# Patient Record
Sex: Male | Born: 1995 | Race: Black or African American | Hispanic: No | Marital: Single | Smoking: Never smoker
Health system: Southern US, Community
[De-identification: ages and names within clinical notes are randomized; demographics above are authoritative.]

## PROBLEM LIST (undated history)

## (undated) HISTORY — PX: ABDOMINAL SURGERY: SHX537

---

## 2016-02-21 ENCOUNTER — Emergency Department (INDEPENDENT_AMBULATORY_CARE_PROVIDER_SITE_OTHER)
Admission: EM | Admit: 2016-02-21 | Discharge: 2016-02-21 | Disposition: A | Discharge status: Home / Self Care | Payer: Medicaid Other | Source: Home / Self Care | Attending: Family Medicine | Admitting: Family Medicine

## 2016-02-21 ENCOUNTER — Emergency Department (INDEPENDENT_AMBULATORY_CARE_PROVIDER_SITE_OTHER): Payer: Medicaid Other

## 2016-02-21 ENCOUNTER — Encounter (HOSPITAL_COMMUNITY): Payer: Self-pay | Admitting: *Deleted

## 2016-02-21 DIAGNOSIS — S93401A Sprain of unspecified ligament of right ankle, initial encounter: Secondary | ICD-10-CM | POA: Diagnosis not present

## 2016-02-21 MED ORDER — IBUPROFEN 800 MG PO TABS: 800.0000 mg | ORAL_TABLET | Freq: Three times a day (TID) | ORAL | Status: AC

## 2016-02-21 NOTE — Discharge Instructions (Signed)
Wear ankle support as needed for comfort, activity as tolerated. advil or pain medicine and ice as needed, return or see orthopedist if further problems.

## 2016-02-21 NOTE — ED Provider Notes (Signed)
CSN: [id number]     Arrival date & time 02/21/16  1727 History   First MD Initiated Contact with Patient 02/21/16 1806     Chief Complaint  Patient presents with  . Ankle Pain   (Consider location/radiation/quality/duration/timing/severity/associated sxs/prior Treatment) Patient is a 20 y.o. male presenting with ankle pain. The history is provided by the patient.  Ankle Pain Location:  Ankle Time since incident:  1 day Injury: yes   Mechanism of injury: fall   Mechanism of injury comment:  Running track and stepped in hole twisting right ankle Ankle location:  R ankle   History reviewed. No pertinent past medical history. History reviewed. No pertinent past surgical history. No family history on file. Social History  Substance Use Topics  . Smoking status: Never Smoker   . Smokeless tobacco: None  . Alcohol Use: No    Review of Systems  Constitutional: Negative.   Musculoskeletal: Positive for joint swelling and gait problem.  Skin: Negative.   All other systems reviewed and are negative.   Allergies  Review of patient's allergies indicates no known allergies.  Home Medications   Prior to Admission medications   Not on File   Meds Ordered and Administered this Visit  Medications - No data to display  BP 112/69 mmHg  Pulse 79  Temp(Src) 98.8 F (37.1 C) (Oral)  Resp 12  SpO2 100% No data found.   Physical Exam  Constitutional: He is oriented to person, place, and time. He appears well-developed and well-nourished. No distress.  Musculoskeletal: He exhibits tenderness.       Right ankle: He exhibits decreased range of motion and swelling. He exhibits no deformity and normal pulse. Tenderness. Lateral malleolus tenderness found. No medial malleolus, no head of 5th metatarsal and no proximal fibula tenderness found. Achilles tendon normal. Achilles tendon exhibits normal Thompson's test results.       Feet:  Neurological: He is alert and oriented to person,  place, and time.  Skin: Skin is warm and dry.  Nursing note and vitals reviewed.   ED Course  Procedures (including critical care time)  Labs Review Labs Reviewed - No data to display  Imaging Review No results found.   Visual Acuity Review  Right Eye Distance:   Left Eye Distance:   Bilateral Distance:    Right Eye Near:   Left Eye Near:    Bilateral Near:         MDM  No diagnosis found.  Meds ordered this encounter  Medications  . ibuprofen (ADVIL,MOTRIN) 800 MG tablet    Sig: Take 1 tablet (800 mg total) by mouth 3 (three) times daily.    Dispense:  30 tablet    Refill:  0      Linna Hoff, MD 02/21/16 [phone]

## 2016-02-21 NOTE — ED Notes (Signed)
Pt  Reports  He stepped  In a  [employer]  And  Injured  His  r  Ankle    -  He  Has  Pain  In  The  Affected  Ankle  With  Pain     On  Weight   Bearing

## 2016-08-18 ENCOUNTER — Encounter (HOSPITAL_COMMUNITY): Payer: Self-pay | Admitting: Emergency Medicine

## 2016-08-18 DIAGNOSIS — Z5321 Procedure and treatment not carried out due to patient leaving prior to being seen by health care provider: Secondary | ICD-10-CM | POA: Insufficient documentation

## 2016-08-18 DIAGNOSIS — R109 Unspecified abdominal pain: Secondary | ICD-10-CM | POA: Insufficient documentation

## 2016-08-18 LAB — COMPREHENSIVE METABOLIC PANEL
ALT: 59 U/L (ref 17–63)
ANION GAP: 5 (ref 5–15)
AST: 104 U/L — ABNORMAL HIGH (ref 15–41)
Albumin: 4 g/dL (ref 3.5–5.0)
Alkaline Phosphatase: 39 U/L (ref 38–126)
BILIRUBIN TOTAL: 1.1 mg/dL (ref 0.3–1.2)
BUN: 10 mg/dL (ref 6–20)
CHLORIDE: 104 mmol/L (ref 101–111)
CO2: 29 mmol/L (ref 22–32)
Calcium: 9.4 mg/dL (ref 8.9–10.3)
Creatinine, Ser: 1.05 mg/dL (ref 0.61–1.24)
Glucose, Bld: 103 mg/dL — ABNORMAL HIGH (ref 65–99)
POTASSIUM: 4 mmol/L (ref 3.5–5.1)
Sodium: 138 mmol/L (ref 135–145)
TOTAL PROTEIN: 6.7 g/dL (ref 6.5–8.1)

## 2016-08-18 LAB — CBC WITH DIFFERENTIAL/PLATELET
Basophils Absolute: 0 10*3/uL (ref 0.0–0.1)
Basophils Relative: 0 %
EOS ABS: 0.1 10*3/uL (ref 0.0–0.7)
Eosinophils Relative: 1 %
HCT: 39.8 % (ref 39.0–52.0)
Hemoglobin: 12.9 g/dL — ABNORMAL LOW (ref 13.0–17.0)
LYMPHS ABS: 1.9 10*3/uL (ref 0.7–4.0)
LYMPHS PCT: 37 %
MCH: 29.8 pg (ref 26.0–34.0)
MCHC: 32.4 g/dL (ref 30.0–36.0)
MCV: 91.9 fL (ref 78.0–100.0)
MONO ABS: 0.3 10*3/uL (ref 0.1–1.0)
MONOS PCT: 7 %
Neutro Abs: 2.8 10*3/uL (ref 1.7–7.7)
Neutrophils Relative %: 55 %
PLATELETS: 213 10*3/uL (ref 150–400)
RBC: 4.33 MIL/uL (ref 4.22–5.81)
RDW: 12.4 % (ref 11.5–15.5)
WBC: 5.1 10*3/uL (ref 4.0–10.5)

## 2016-08-18 LAB — URINALYSIS, ROUTINE W REFLEX MICROSCOPIC
Bilirubin Urine: NEGATIVE
Glucose, UA: NEGATIVE mg/dL
Hgb urine dipstick: NEGATIVE
Ketones, ur: NEGATIVE mg/dL
Leukocytes, UA: NEGATIVE
NITRITE: NEGATIVE
PROTEIN: NEGATIVE mg/dL
SPECIFIC GRAVITY, URINE: 1.008 (ref 1.005–1.030)
pH: 7 (ref 5.0–8.0)
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
## 2016-08-18 NOTE — ED Notes (Signed)
Called Pt for reassessment, no response.

## 2016-08-19 ENCOUNTER — Emergency Department (HOSPITAL_COMMUNITY)
Admission: EM | Admit: 2016-08-19 | Discharge: 2016-08-19 | Disposition: A | Discharge status: Home / Self Care | Payer: Medicaid Other | Attending: Emergency Medicine | Admitting: Emergency Medicine

## 2016-08-19 NOTE — ED Notes (Signed)
Pt was called multiple times for reassessment, no response.

## 2016-08-19 NOTE — ED Notes (Signed)
Pt called for room, no response. 

## 2017-03-06 IMAGING — DX DG ANKLE COMPLETE 3+V*R*
3 series · 3 of 3 positions shown · non-contrast
Comparison: None.

CLINICAL DATA: Rolled the right ankle 2 days ago. Pain and
swelling.

EXAM:
RIGHT ANKLE - COMPLETE 3+ VIEW

[ankle ap]
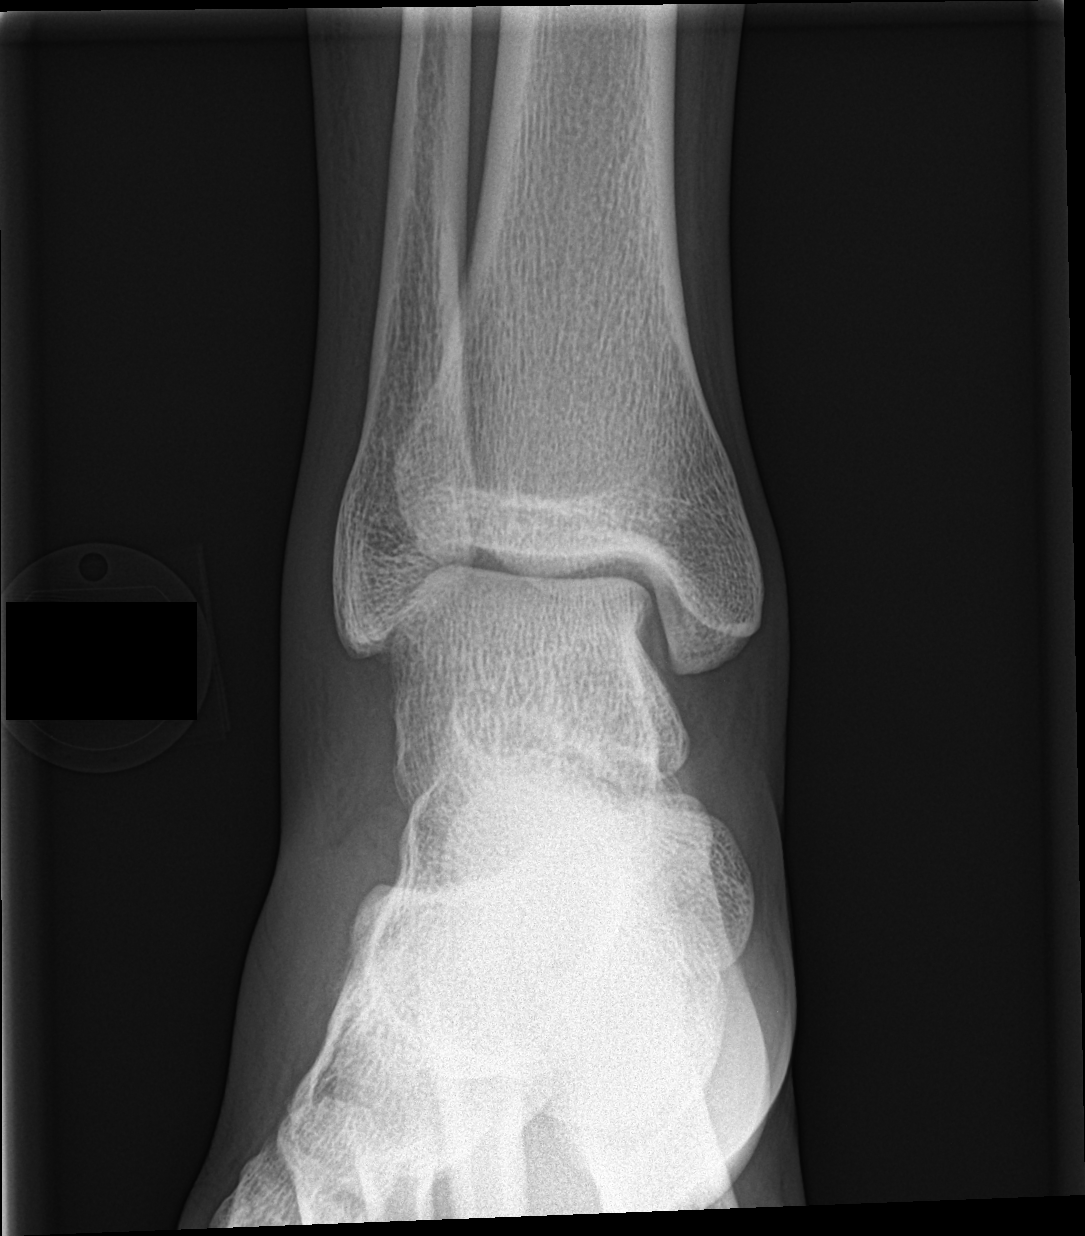

[ankle obl]
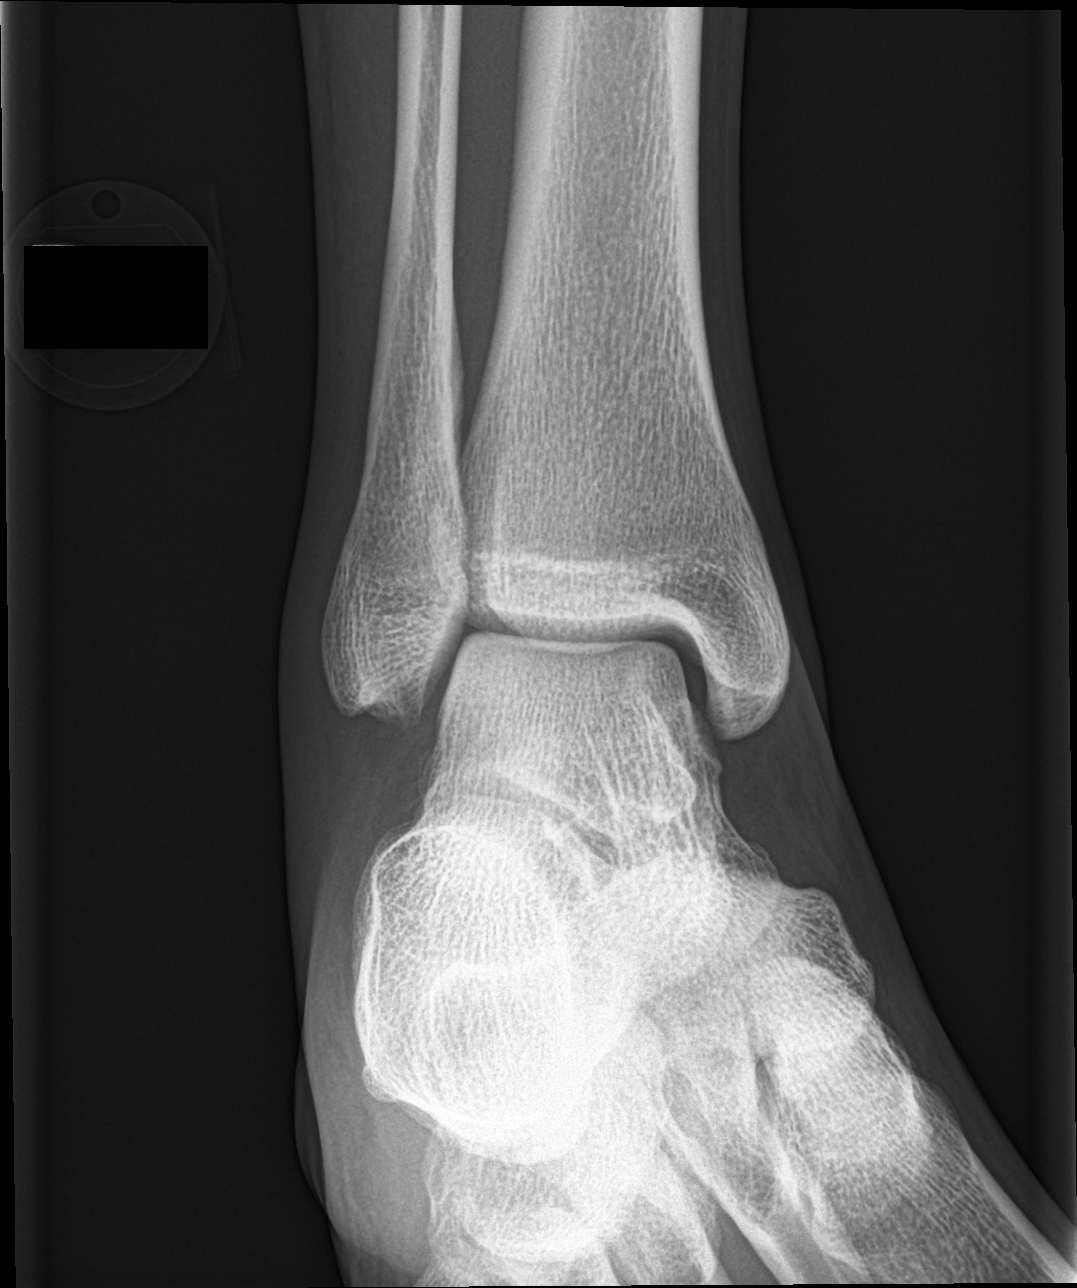

[ankle lat]
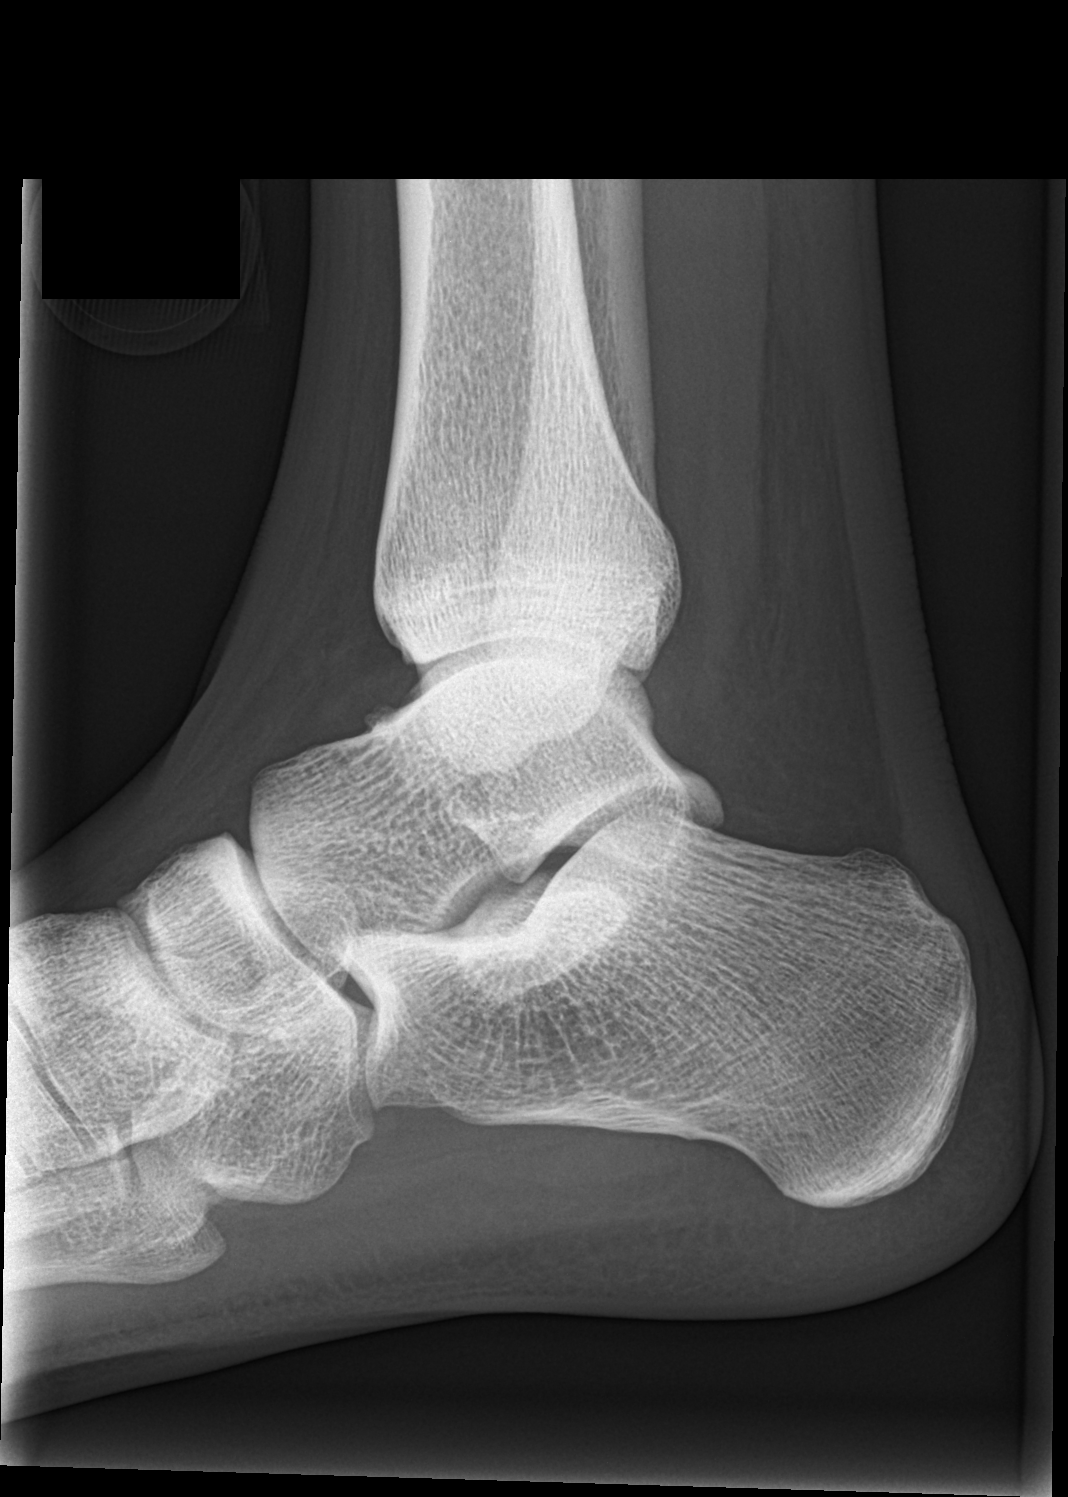

[3 of 3 positions shown; findings below may reference images not displayed]

FINDINGS: There is no evidence of fracture, dislocation, or joint effusion.
There is no evidence of arthropathy or other focal bone abnormality.
Soft tissues are unremarkable.
IMPRESSION: Negative.
